# Patient Record
Sex: Female | Born: 1955 | Smoking: Never smoker
Health system: Northeastern US, Community
[De-identification: ages and names within clinical notes are randomized; demographics above are authoritative.]

---

## 2019-02-14 HISTORY — PX: CATARACT REMOVAL INSERTION OF LENS: OPH121

## 2019-02-14 HISTORY — PX: VITRECTOMY PARS PLANA REMOVE PRERETINAL MEMBRANE: PRO01201

## 2019-04-07 HISTORY — PX: RETINAL DETACHMENT REPAIR W/ SCLERAL BUCKLE LE: SHX2338

## 2020-04-04 ENCOUNTER — Emergency Department
Admission: AD | Admit: 2020-04-04 | Discharge: 2020-04-04 | Disposition: A | Discharge status: Home / Self Care | Payer: No Typology Code available for payment source | Source: Ambulatory Visit | Attending: Emergency Medicine | Admitting: Emergency Medicine

## 2020-04-04 ENCOUNTER — Encounter (HOSPITAL_BASED_OUTPATIENT_CLINIC_OR_DEPARTMENT_OTHER): Payer: Self-pay

## 2020-04-04 DIAGNOSIS — H5711 Ocular pain, right eye: Secondary | ICD-10-CM | POA: Diagnosis present

## 2020-04-04 DIAGNOSIS — H539 Unspecified visual disturbance: Secondary | ICD-10-CM | POA: Diagnosis present

## 2020-04-04 MED ORDER — TETRACAINE HCL 0.5 % OP SOLN
OPHTHALMIC | Status: DC
Start: 2020-04-04 — End: 2020-04-04
  Filled 2020-04-04: qty 4

## 2020-04-04 MED ORDER — FLUORESCEIN SODIUM 1 MG OP STRP
ORAL_STRIP | OPHTHALMIC | Status: DC
Start: 2020-04-04 — End: 2020-04-04
  Filled 2020-04-04: qty 1

## 2020-04-04 NOTE — Narrator Note (Signed)
MD examined patient immediately. Patient then cleared for discharge and was given discharge instructions. Family member to transport patient to [facility].

## 2020-04-04 NOTE — UC Triage Notes (Signed)
Presents with right eye pain and photosensitivity times five days. Hx of retinal detachment.

## 2020-04-04 NOTE — UC Provider Notes (Signed)
The patient was seen primarily by me. The nursing record was reviewed. Select prior records as available electronically through the Epic record were reviewed.    History, physical exam and disposition were conducted with an official hospital [place] interpreter.    I began my evaluation of the patient at 7:21 PM.    HPI:    Hannah Warren is a 64 year old female patient who presents for evaluation of right eye pain. She has had this for five days. She had eye surgery last year in [place]. This was complicated by a retinal detachment which required further procedures.    ROS: Pertinent positives were reviewed as per the HPI above.    Specifically, no fever, chills, double vision, nosebleeds, cough, dyspnea, chest pain, nausea, vomiting, abdominal pain, rash, dysuria, joint pains, extremity swelling or headaches. All other systems reviewed and negative.    Allergies: Review of Patient's Allergies indicates:  Not on File    Hannah Warren  Language of care: [place] Hannah Warren)  MRN: [id number]  PCP: None  Mode of arrival: Walk-in.  Arrival time:     Chief complaint: No chief complaint on file.    Past Medical History:  No past medical history on file.    Social History:   Tobacco: No    Triage Vital Signs:   ED Triage Vitals [04/04/20 1917]   ED Triage Vitals Brief Group      Temp 98.5 F      Pulse 80      Resp 16      BP (!) 178/91      SpO2 97 %      Pain Score      I reviewed the triage vital signs.    Medications:  None       Physical Exam:   GENERAL: No acute distress, non-toxic     HEAD: No evidence of trauma.     EYES: No lid swelling. No periorbital swelling. No crusting or discharge Sclera anicteric. No conjunctival injection. Pupils equal, round, and reactive to light. Extraocular movements intact without pain or diplopia. No hyphema. No hypopyon. Right temporal superior field cut. Photophobia present. IOPs OD 8, OS 10.    NECK: Supple. No stridor.     PULMONARY: Respirations even and unlabored. No  accessory muscle use.      MUSCULOSKELETAL: No obvious deformities or trauma. Warm and well perfused.     SKIN: Warm and dry, no rash on exposed skin.    NEUROLOGIC: Alert and oriented x3. Speech fluent without dysarthria.     PSYCHIATRIC: Appropriate, cooperative.      Radiology Results:  N/A Medications Given:  Medications - No data to display   EKG:  N/A      Medical decision-making and course:  64 year old female patient presents with right eye pain and vision changes. She has a history of retinal detachment and lens replacement surgery last year in [place].     I recommended she proceed to [facility] ED for further evaluation. I called to place an expect.    I counseled the patient on her results, diagnosis, expected course, and treatments. I gave verbal discharge instructions, including reasons to return. She was in agreement with the plan for discharge and I answered all questions to her satisfaction.    Diagnosis/Diagnoses:  (H57.11) Acute right eye pain    (H53.9) Vision changes     Disposition:  Discharged.    Condition at disposition:  Stable.    Hannah Warren  Ladene Artist, MD  04/04/2020   Emergency Medicine  [facility]    This Emergency Department patient encounter note was created using voice-recognition software and in real time during the ED visit. Please excuse any typographical errors that have not yet been reviewed and corrected.

## 2020-04-04 NOTE — Narrator Note (Signed)
Patient Disposition  Patient education for diagnosis, medications, activity, diet and follow-up.  Patient left UC 7:49 PM.  Patient rep received written instructions.    Interpreter to provide instructions: Yes; Interpreter ID: [place]    Patient belongings with patient: YES    Have all existing LDAs been addressed? N/A    Have all IV infusions been stopped? N/A    Destination: Home

## 2020-04-04 NOTE — Discharge Instructions (Signed)
[facility]

## 2020-04-16 MED FILL — KETOROLAC  SOL 0.5%: 56 days supply | Qty: 15 | Fill #0

## 2020-04-16 MED FILL — PREDNISOLONE SUS 1% OP: 56 days supply | Qty: 15 | Fill #0

## 2020-04-23 ENCOUNTER — Ambulatory Visit: Payer: No Typology Code available for payment source | Attending: Ophthalmology | Admitting: Ophthalmology

## 2020-04-23 ENCOUNTER — Other Ambulatory Visit: Payer: Self-pay

## 2020-04-23 ENCOUNTER — Encounter (HOSPITAL_BASED_OUTPATIENT_CLINIC_OR_DEPARTMENT_OTHER): Payer: Self-pay | Admitting: Ophthalmology

## 2020-04-23 DIAGNOSIS — H524 Presbyopia: Secondary | ICD-10-CM

## 2020-04-23 NOTE — Progress Notes (Signed)
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  Retina attached today.  Followed by Dr. Eula Listen at [facility] who is treating with ketorolac and Pred forte OD qid  Glaucoma suspect:  Prominent optic nerve cupping.  Needs baseline testing  Cataract OS/Pseudophakia OD  Presbyopia    Plan:  Rx given for spectacle lenses if desired.  Continue keratolac and Pred forte OD qid  Return for formal visual field testing and optic nerve imaging.  Needs pachymetry  See 1 month

## 2020-04-23 NOTE — Progress Notes (Signed)
Kashana Breach Myriam Brandhorst is a 64 year old female.     Emergency visit     Pt reports eyelid swelling, with (white) discharge OD.  Right eye closed shut in the mornings.    Pt was seen at [facility] 2 weeks ago [facility] = normal eye exam.  Retina exam was good.    S/p retina detachment surgery and cataract surgery OD in [place] 05/2018.    No pain, No headache.  No flashes or floaters.

## 2020-04-24 ENCOUNTER — Other Ambulatory Visit: Payer: Self-pay

## 2020-04-24 ENCOUNTER — Ambulatory Visit: Payer: No Typology Code available for payment source | Attending: Ophthalmology

## 2020-04-24 DIAGNOSIS — H40003 Preglaucoma, unspecified, bilateral: Secondary | ICD-10-CM | POA: Diagnosis not present

## 2020-04-25 NOTE — Progress Notes (Signed)
Both eyes repeated few times  Procedure performed by technician under supervision of attending MD.

## 2020-05-01 ENCOUNTER — Telehealth (HOSPITAL_BASED_OUTPATIENT_CLINIC_OR_DEPARTMENT_OTHER): Payer: Self-pay

## 2020-05-01 NOTE — Telephone Encounter (Signed)
Spoke with pt through interpreter De Nurse #    [phone]   Pt has an appointment January 25 th and she was calling to see if she could be seen sooner because she has some free time available.  I explained that she should keep her appt on 05-21-20 and if anything changes for her to call office.  Pt understands and agree's with plan

## 2020-05-01 NOTE — Telephone Encounter (Signed)
-----   Message from Lydia Cintron sent at 05/01/2020 11:57 AM EST -----  Contact: [phone]  Scratch in her R eye

## 2020-05-01 NOTE — Telephone Encounter (Signed)
-----   Message from [facility] sent at 05/01/2020 11:57 AM EST -----  Contact: [phone]  Scratch in her R eye

## 2020-05-20 ENCOUNTER — Encounter (HOSPITAL_BASED_OUTPATIENT_CLINIC_OR_DEPARTMENT_OTHER): Payer: Self-pay | Admitting: Ophthalmology

## 2020-05-20 ENCOUNTER — Other Ambulatory Visit: Payer: Self-pay

## 2020-05-20 ENCOUNTER — Ambulatory Visit: Payer: No Typology Code available for payment source | Attending: Ophthalmology | Admitting: Ophthalmology

## 2020-05-20 DIAGNOSIS — H3581 Retinal edema: Secondary | ICD-10-CM | POA: Insufficient documentation

## 2020-05-20 DIAGNOSIS — H40003 Preglaucoma, unspecified, bilateral: Secondary | ICD-10-CM | POA: Diagnosis not present

## 2020-05-20 DIAGNOSIS — Z961 Presence of intraocular lens: Secondary | ICD-10-CM | POA: Insufficient documentation

## 2020-05-20 MED ORDER — BRIMONIDINE TARTRATE 0.2 % OP SOLN
1.00 [drp] | Freq: Two times a day (BID) | OPHTHALMIC | 11 refills | Status: AC
Start: 2020-05-20 — End: 2021-05-20

## 2020-05-20 NOTE — Progress Notes (Signed)
Impression:  Glaucoma suspect:  Prominent optic nerve cupping.  IOP mildly elevated OD>OS (?early steroid effect?).  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  Optic nerve imaging relatively normal with mrnfl 88/92.  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   Followed by Dr. Eula Listen at [facility] who is treating with ketorolac and Pred forte OD qid with decreased macular edema by OCT  Cataract OS/Pseudophakia OD  Presbyopia    Plan:  Continue keratolac and Pred forte OD qid  Trial of brimonidine .2% OD while using prednisolone.  Needs pachymetry (machine being repaired today)  See 1 month

## 2020-05-20 NOTE — Progress Notes (Signed)
Here for f/U,H/O glaucoma Suspect,Macular edema OD after S/P ERM peel with Vitrectomy,Cataract OS,PSeudophakia OD,Presbyopia.  Vf and Macula/ON Oct was done in 04/25/2020.  Macular Oct done in OD today.   Vision is stable OU.   Eye feels swollen in the morning.   No pain.   No flashes/floaters.    Ocular meds   Ketorolac 4/day OD LD this am   Prednisolone 4/day OD LD this am

## 2020-05-21 ENCOUNTER — Ambulatory Visit: Payer: No Typology Code available for payment source | Attending: Internal Medicine | Admitting: Internal Medicine

## 2020-05-21 ENCOUNTER — Ambulatory Visit (HOSPITAL_BASED_OUTPATIENT_CLINIC_OR_DEPARTMENT_OTHER): Payer: Self-pay | Admitting: Ophthalmology

## 2020-05-21 ENCOUNTER — Encounter (HOSPITAL_BASED_OUTPATIENT_CLINIC_OR_DEPARTMENT_OTHER): Payer: Self-pay | Admitting: Internal Medicine

## 2020-05-21 DIAGNOSIS — Z85038 Personal history of other malignant neoplasm of large intestine: Secondary | ICD-10-CM | POA: Diagnosis present

## 2020-05-21 DIAGNOSIS — R252 Cramp and spasm: Secondary | ICD-10-CM | POA: Diagnosis present

## 2020-05-21 DIAGNOSIS — Z7689 Persons encountering health services in other specified circumstances: Secondary | ICD-10-CM | POA: Diagnosis present

## 2020-05-21 NOTE — Progress Notes (Signed)
Hannah Warren is a 65 year old female is seen to day to establish care   Interpreter present yes    Bilateral hands and feet cramp for 3 months. symptoms  are intermittent   Feet pain get better when she walks   Pain is more frequent at night   Patient has colon cancer - she had surgery in [place] 2012- has colonoscopy   Psychosocial - drinks occasional alcohol , no smoking , no drug - Moved to the [place] 4 years ago - she lives with friends   She has two children they are living in [place]   Mother died of heart Attack at age 32  Father died of lung complications      Review of Systems   Constitutional: Negative.    HENT: Negative.    Eyes: Positive for visual disturbance (in the right eye ).   Respiratory: Negative.    Cardiovascular: Negative.    Gastrointestinal: Negative.    Genitourinary: Negative.    Musculoskeletal: Negative.    Neurological: Negative.    Hematological: Negative.    Psychiatric/Behavioral: Negative.    OBJECTIVE:   She is  speaking in full sentences  .       (Z76.89) Encounter to establish care  (primary encounter diagnosis)  Comment: Health maintenance reviewed and updated      (R25.2) Cramp and spasm  Patient wants to wait for the work up as she is plannning to travel next month    Plan: CBC WITH PLATELET, COMPREHENSIVE METABOLIC         PANEL, TSH (THYROID STIMULATING HORMONE),         HEMOGLOBIN A1C, PROTEIN ELECTROPHORESIS REFLEX,        VITAMIN B12            (Z85.038) History of colon cancer  Comment: will refer patient to GI    We discussed the patients current medications. The patient expressed understanding and no barriers to adherence were identified.

## 2020-05-23 ENCOUNTER — Ambulatory Visit (HOSPITAL_BASED_OUTPATIENT_CLINIC_OR_DEPARTMENT_OTHER): Payer: No Typology Code available for payment source

## 2021-03-06 ENCOUNTER — Other Ambulatory Visit: Payer: Self-pay | Admitting: Internal Medicine

## 2022-05-13 ENCOUNTER — Other Ambulatory Visit (HOSPITAL_BASED_OUTPATIENT_CLINIC_OR_DEPARTMENT_OTHER): Payer: Self-pay | Admitting: Internal Medicine

## 2023-07-29 ENCOUNTER — Other Ambulatory Visit: Payer: Self-pay | Admitting: Internal Medicine

## 2023-12-29 IMAGING — CT Abdomen^ABDOME_TOTAL_COM_CONTRASTE (Adult)
2 of 4 series · 13 of 32 positions shown, 18 images · non-contrast
Comparison: none

[Series 2: s/c · axial · 0.86mm/px · z∈[-117,+213]mm · 6 of 232 slices shown, 11 images]
[im 34/232  soft-tissue]
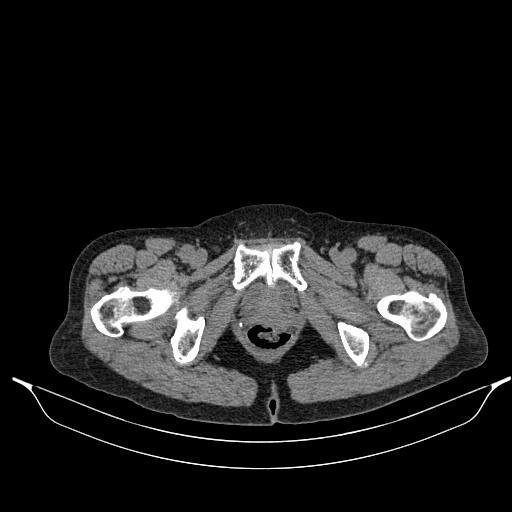
[im 34/232  bone]
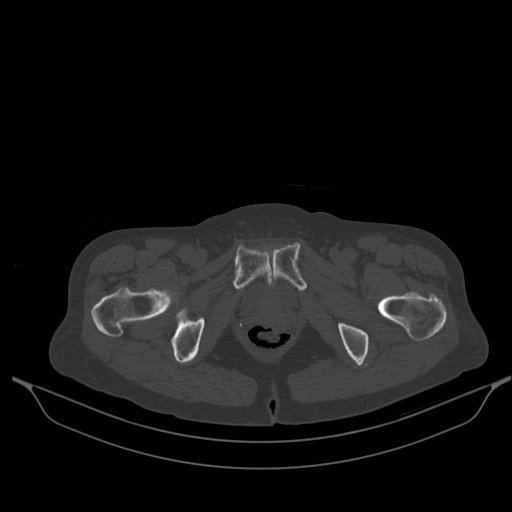
[im 67/232  soft-tissue]
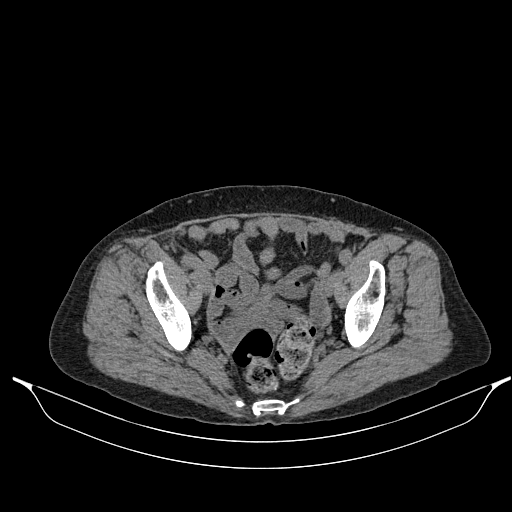
[im 100/232  soft-tissue]
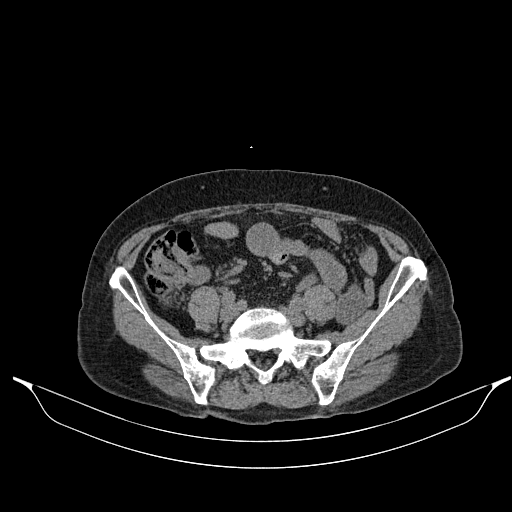
[im 100/232  lung]
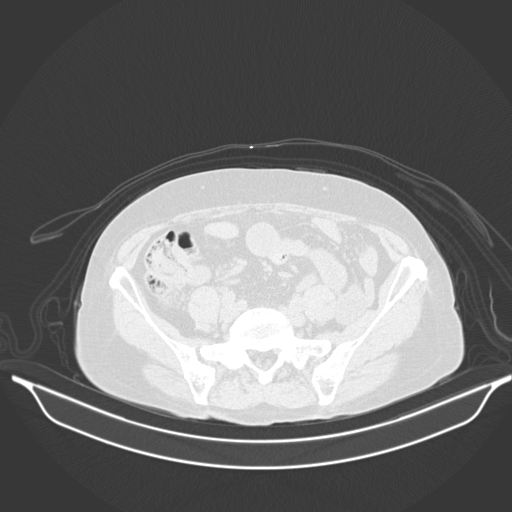
[im 133/232  soft-tissue]
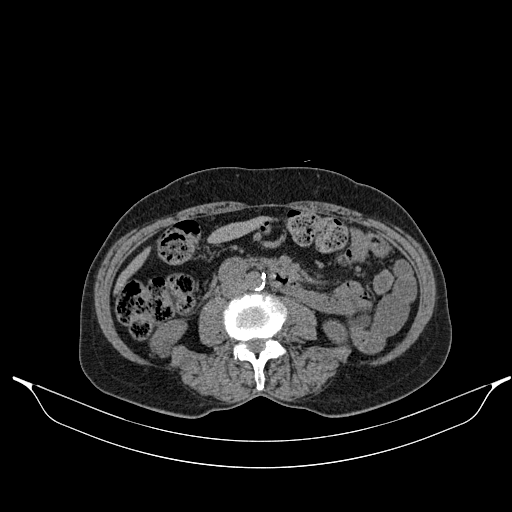
[im 133/232  lung]
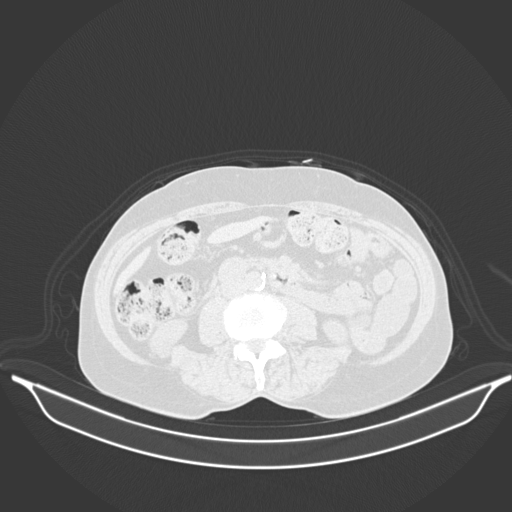
[im 166/232  soft-tissue]
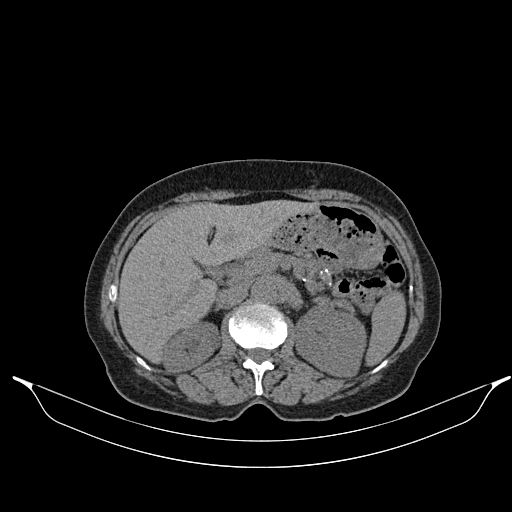
[im 166/232  lung]
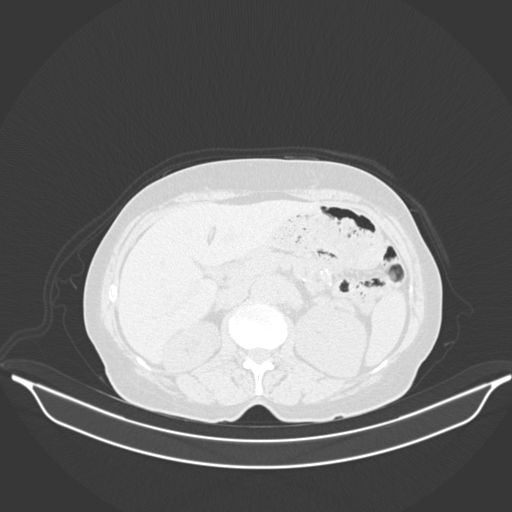
[im 199/232  soft-tissue]
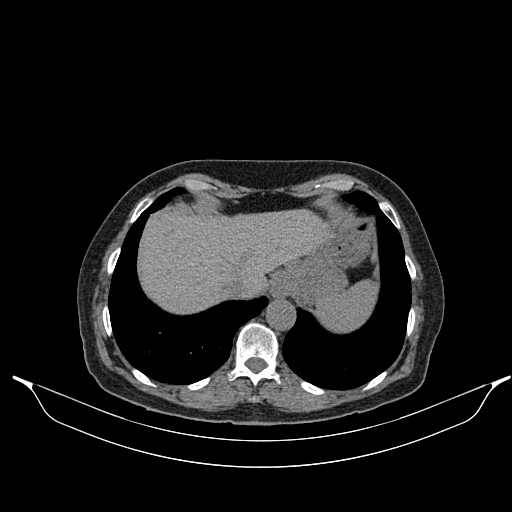
[im 199/232  lung]
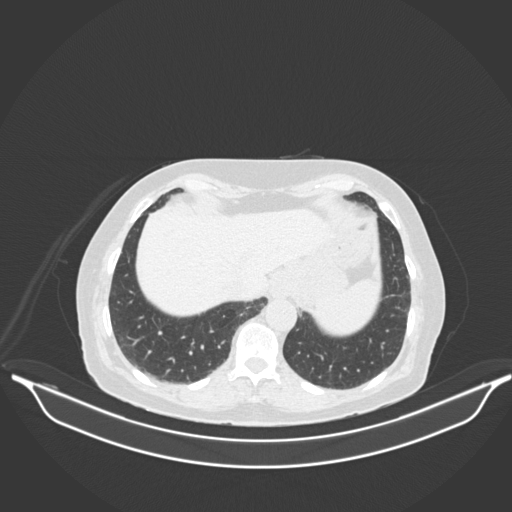

[Series 4: venosa · axial · 0.86mm/px · z∈[-125,+221]mm · 7 of 231 slices shown]
[im 29/231  soft-tissue]
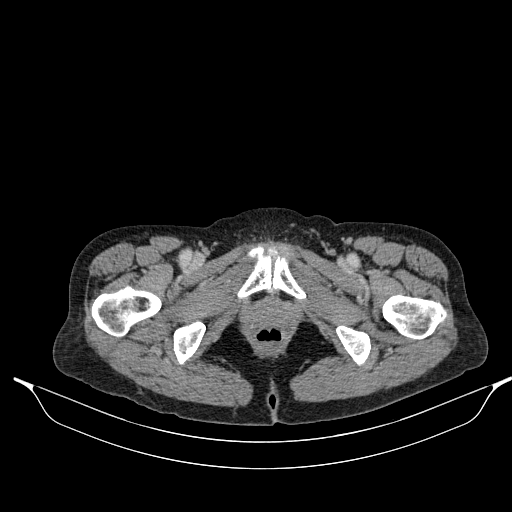
[im 58/231  soft-tissue]
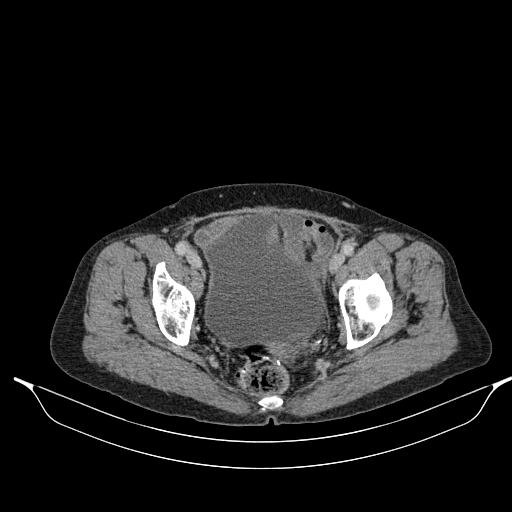
[im 87/231  soft-tissue]
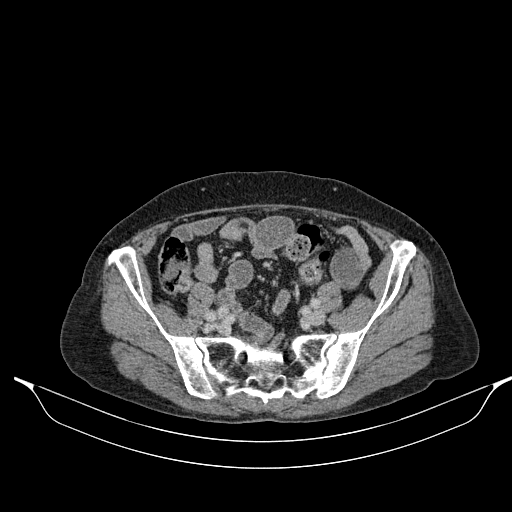
[im 116/231  soft-tissue]
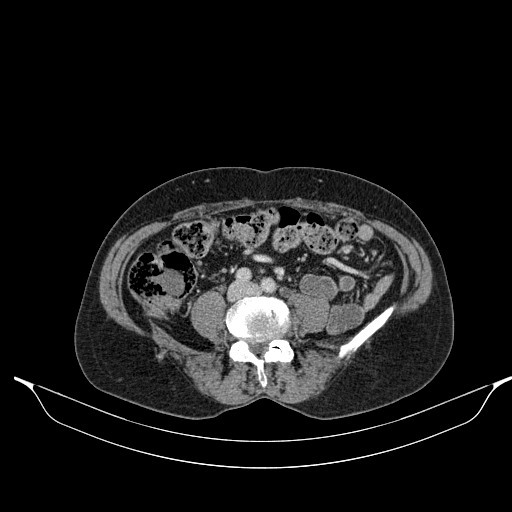
[im 144/231  soft-tissue]
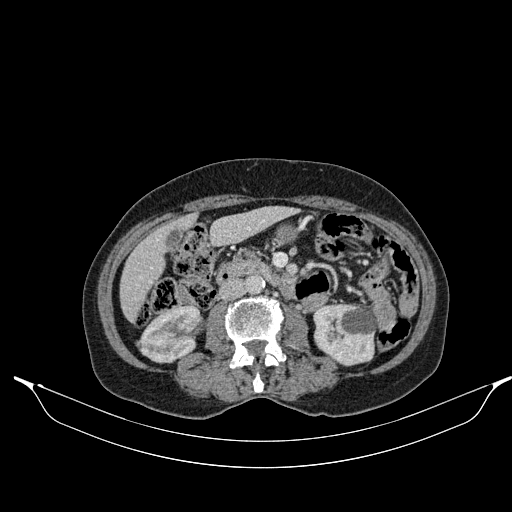
[im 173/231  soft-tissue]
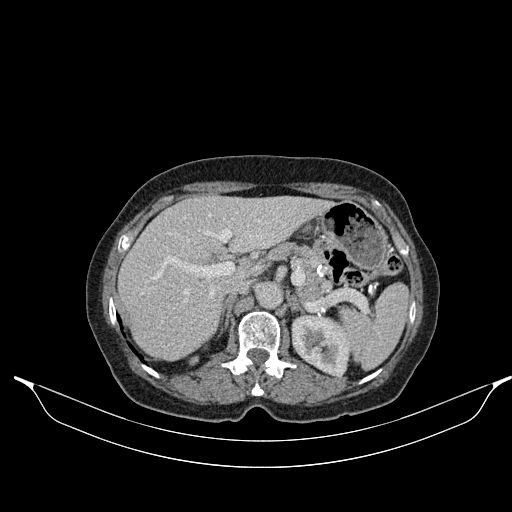
[im 202/231  soft-tissue]
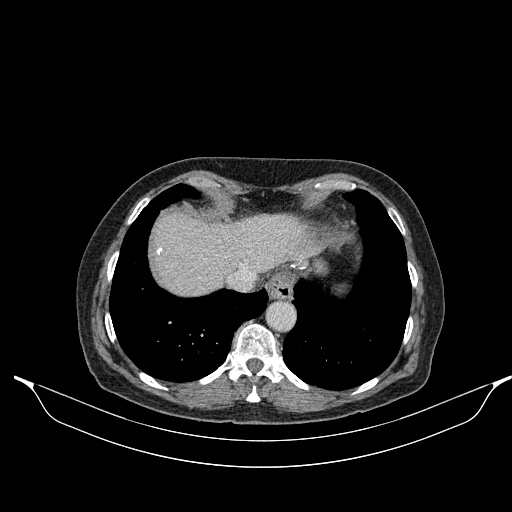

[13 of 32 positions shown; findings below may reference images not displayed]

Médico: -
Técnica:
Aquisições volumétricas com reconstruções multiplanares. Administrado contraste endovenoso.
Relatório:
Fígado com dimensões, morfologia e densidade normais.
Não há dilatação das vias biliares intra ou extra-hepáticas.
TOMOGRAFIA COMPUTADORIZADA DO ABDOME SUPERIOR E PELVE
Pâncreas com morfologia e Ivan Franﬁciente de atenuação preservados. Não há dilatação do ducto pancreático principal.
Baço com topograﬁa, dimensões e densidade normais. Adrenais com aspecto preservado.
Rins tópicos, com dimensões e contornos preservados. Cistos corticais bilaterais com até 3,1 cm. Não há cálculos ou
hidronefrose.
Ureter direito com implante na parede lateral direita da bexiga (pós cirúrgico).
Aorta e veia cava inferior com trajeto e calibre normais. Ateromatose aorto-ilíaca e de ramos viscerais.
Bexiga urinária com capacidade, morfologia e conteúdo normais.
Flebólitos pélvicos.
Ausência de linfonodomegalias ou líquido livre na cavidade.
Pequena hérnia umbilical de conteúdo gorduroso.
Alterações degenerativas na coluna vertebral.
Impressão:
Cistos renais bilaterais.
Ateromatose.
Hérnia umbilical.
[place]

## 2024-03-03 IMAGING — MR RM - ARTICULAR OMBRO DIREITO
4 of 8 series · 18 of 40 positions shown · non-contrast
Comparison: none

[Series 4: PD fat-sat · axial · 3.0mm · 0.31mm/px · z∈[-31,+43]mm · 7 of 24 slices shown (1 of 3)]
[im 1/24]
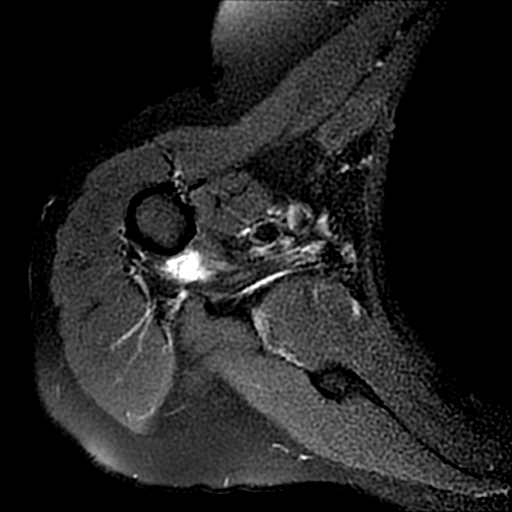
[im 4/24]
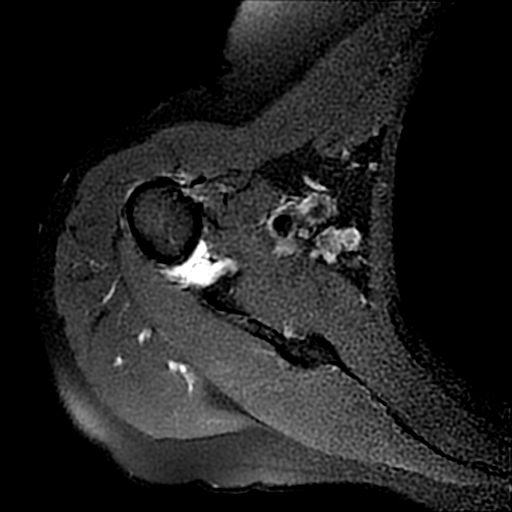
[im 8/24]
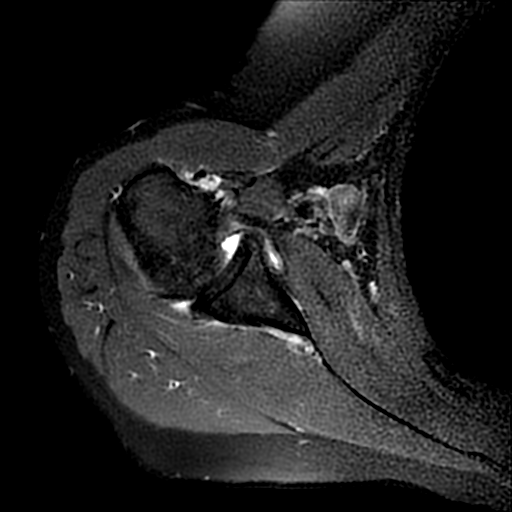
[im 12/24]
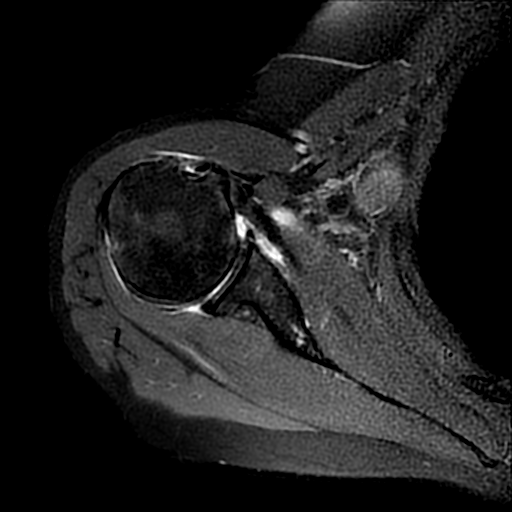
[im 16/24]
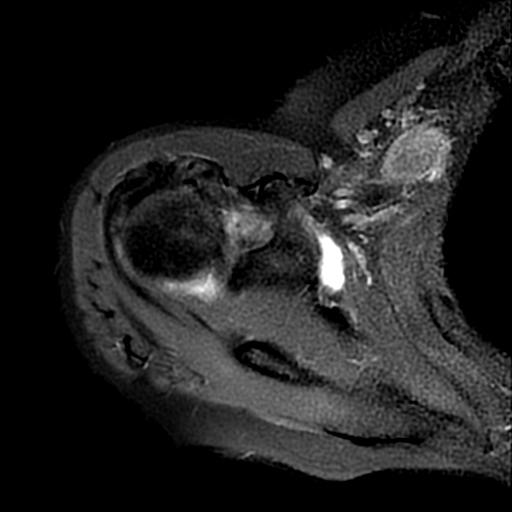
[im 20/24]
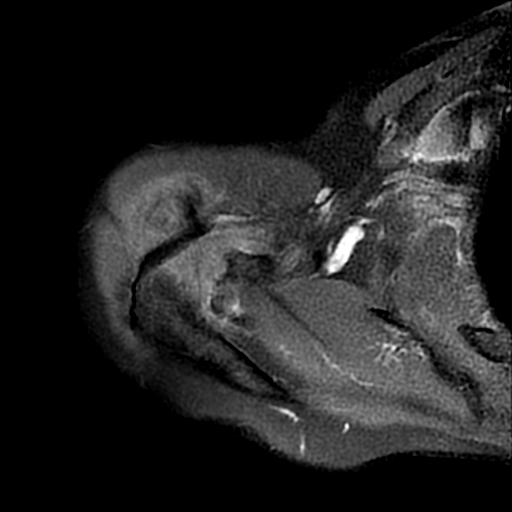
[im 24/24]
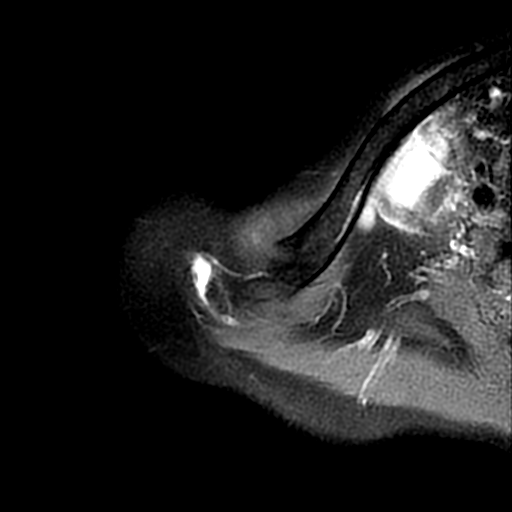

[Series 5: PD fat-sat · coronal · 4.0mm · 0.31mm/px · 5 of 20 slices shown (2 of 3)]
[im 1/20]
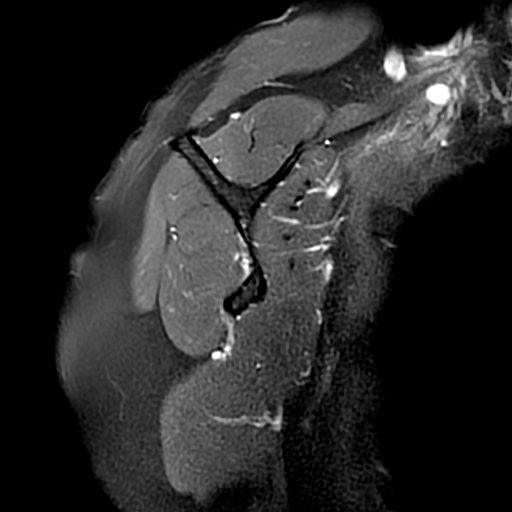
[im 4/20]
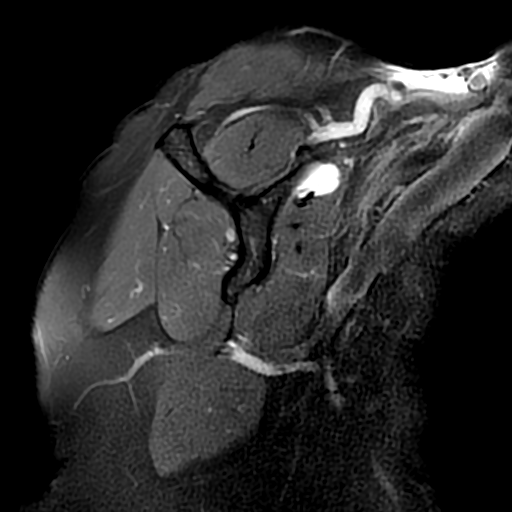
[im 8/20]
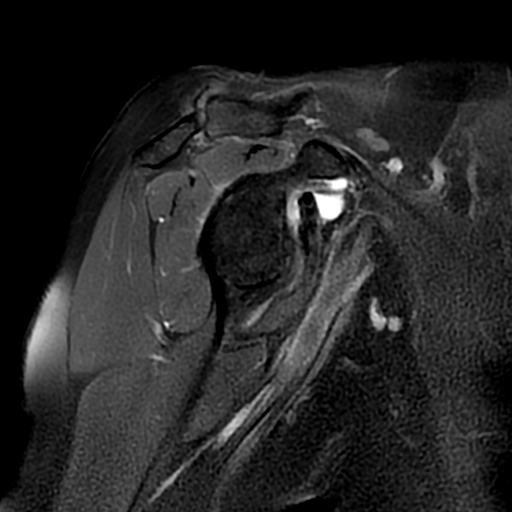
[im 12/20]
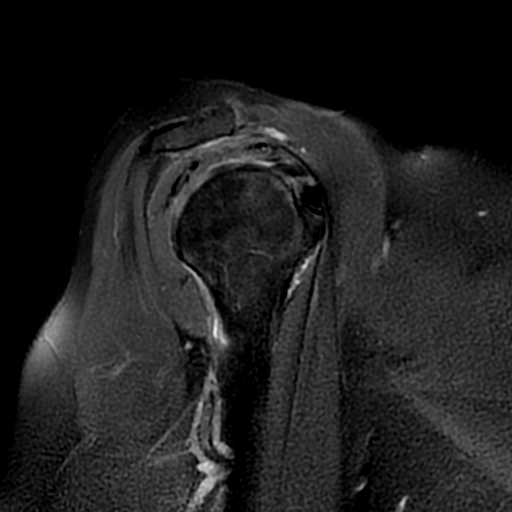
[im 20/20]
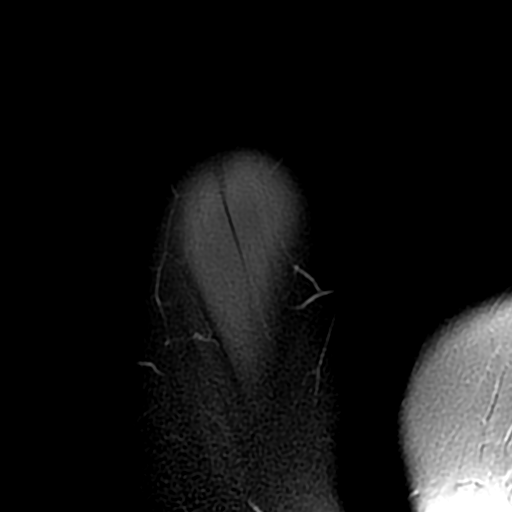

[Series 6: T1 · coronal · 4.0mm · 0.31mm/px · 3 of 20 slices shown]
[im 4/20]
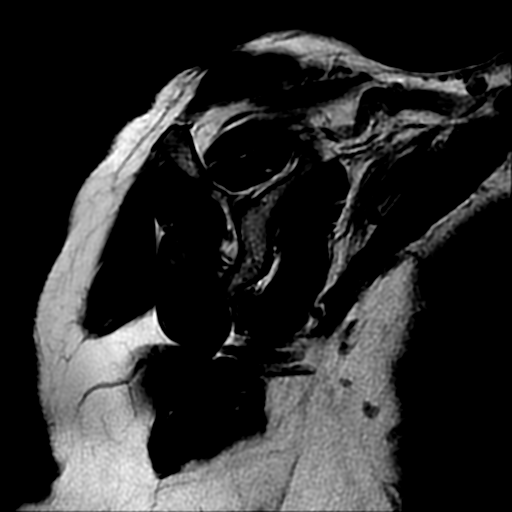
[im 12/20]
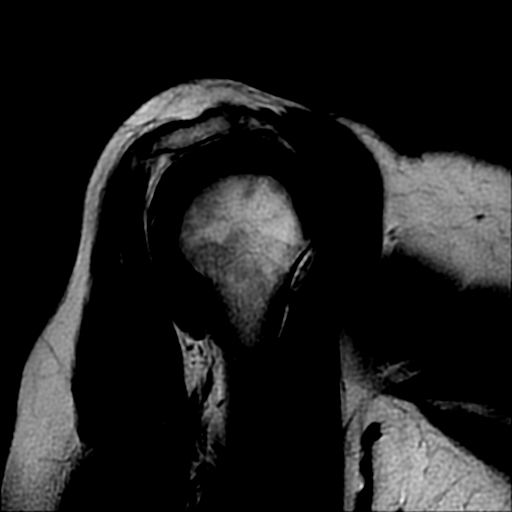
[im 20/20]
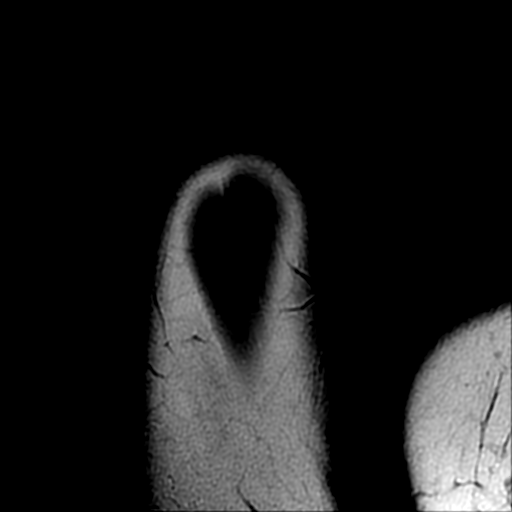

[Series 7: PD fat-sat · oblique · 3.0mm · 0.31mm/px · 3 of 20 slices shown (3 of 3)]
[im 4/20]
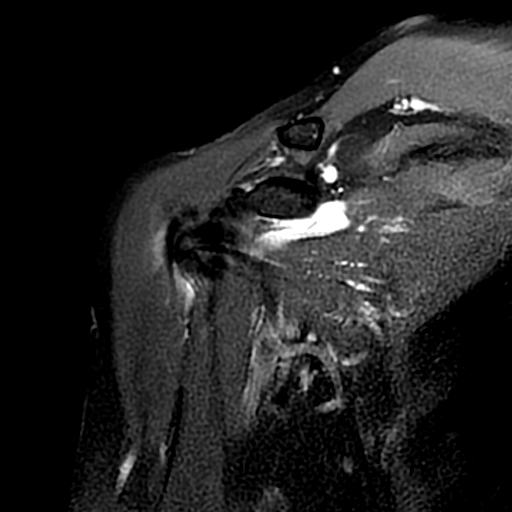
[im 12/20]
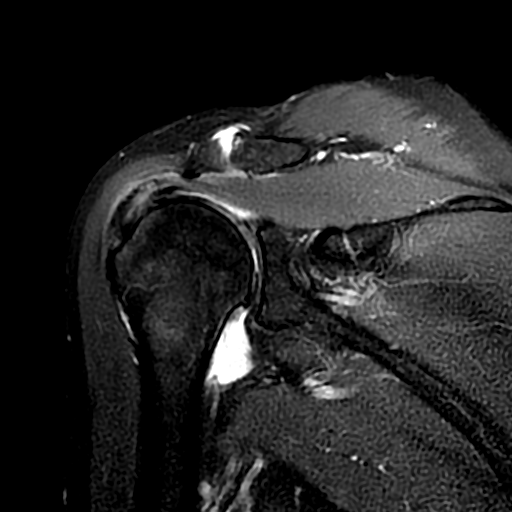
[im 20/20]
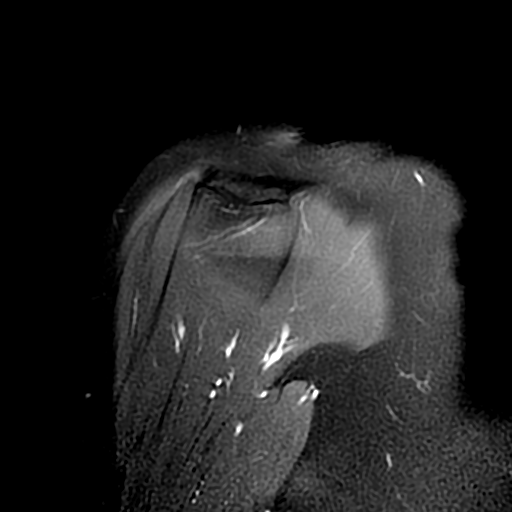

[18 of 40 positions shown; findings below may reference images not displayed]

Médico: -

Método:
Estudo realizado com a técnica FE e FSE, com cortes multiplanares.

Análise:
Moderada artropatia degenerativa acromioclavicular com aﬁlamento condral e osteóﬁtos marginais, notando-se
pequenos cistos e focos de edema ósseo subcondrais, de origem mecânica.
RESSONÂNCIA MAGNÉTICA DO OMBRO DIREITO
Acrômio retilíneo, sem inclinações anômalas signiﬁcativas.
Acentuada tendinopatia do supraespinhal com focos degenerativos intrassubstanciais, sem roturas.
Há leve edema ósseo e pequenos cistos subcorticais na tuberosidade maior umeral, indicando entesopatia. Moderada
tendinopatia do infraespinhal caracterizada por focos degenerativos intrassubstanciais, sem roturas.
Discreta tendinopatia do subescapular que apresenta focos degenerativos intrassubstanciais, sem roturas.
Discreta tendinopatia do segmento articular do cabo longo do bíceps que apresenta leve espessamento e focos
degenerativos intrassubstanciais, sem roturas.
Espessamento/edema da bolsa subacromial/subdeltoídea, indicando leve bursite. Há pequena distensão líquida da
bolsa subcoracóide.
Discreta artropatia degenerativa glenoumeral caracterizada por esboços osteoﬁtários marginais e aﬁlamento condral,
em ambos os componentes, sem repercussões subcondrais.
Demais estruturas ósseas sem alterações evidentes.
Discreto derrame articular glenoumeral com focos de espessamento sinovial, indicando sinovite.
Alteração degenerativa com irregularidade e ﬁssuras nos segmentos superiores do lábio da glenóide, sem
destacamentos.
Ventres musculares de aspecto habitual.

Conclusão:
[facility] - [address] [place] [id number], [place]
Médico: -
Sinais de tendinopatia do manguito rotador, sem roturas.
Entesopatia na tuberosidade maior umeral.
Moderada artropatia degenerativa acromioclavicular.
Discreta tendinopatia da cabeça longa do bíceps.
Bursite subacromial/subdeltoídea e subcoracoide.
Discreta artropatia degenerativa glenoumeral.
Alteração degenerativa do lábio superior da glenoide.
Discreto derrame articular glenoumeral com sinovite.
[facility] - [address] [place] [id number], [place]

## 2024-03-03 IMAGING — MR RM - ARTICULAR OMBRO ESQUERDO
4 of 8 series · 19 of 40 positions shown · non-contrast
Comparison: none

[Series 4: PD fat-sat · axial · 3.0mm · 0.31mm/px · z∈[-7,+71]mm · 7 of 25 slices shown (1 of 3)]
[im 1/25]
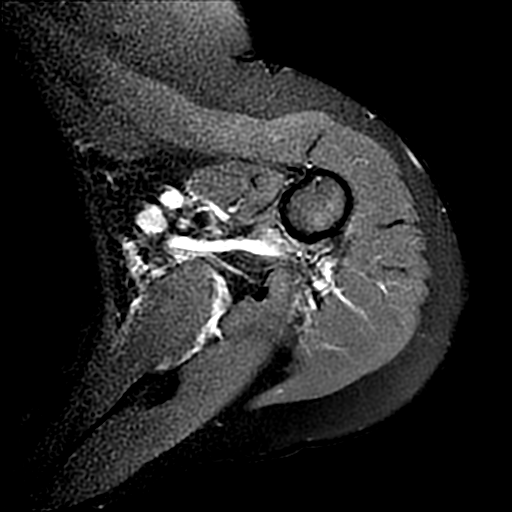
[im 5/25]
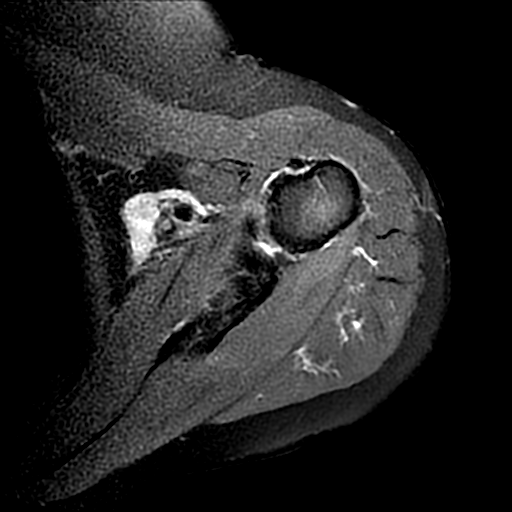
[im 9/25]
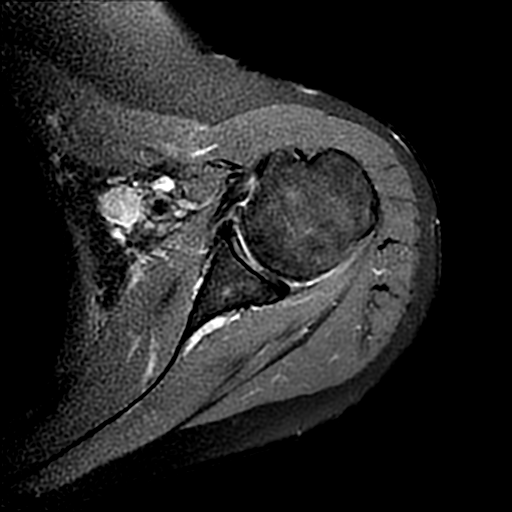
[im 13/25]
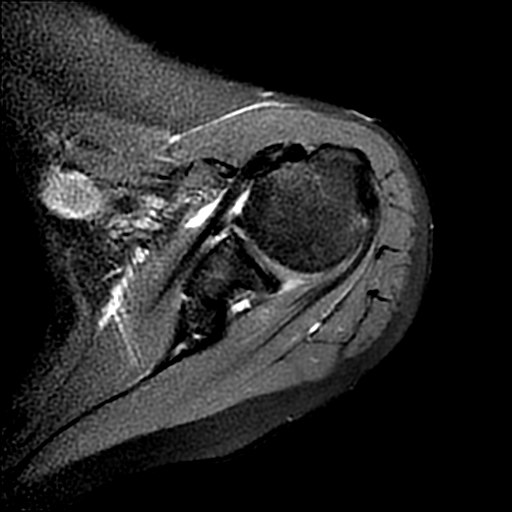
[im 17/25]
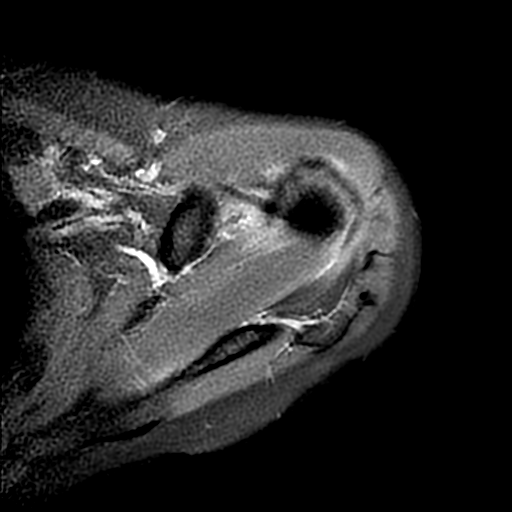
[im 21/25]
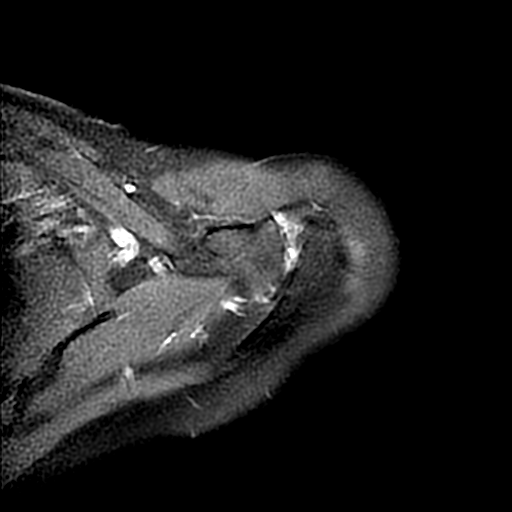
[im 25/25]
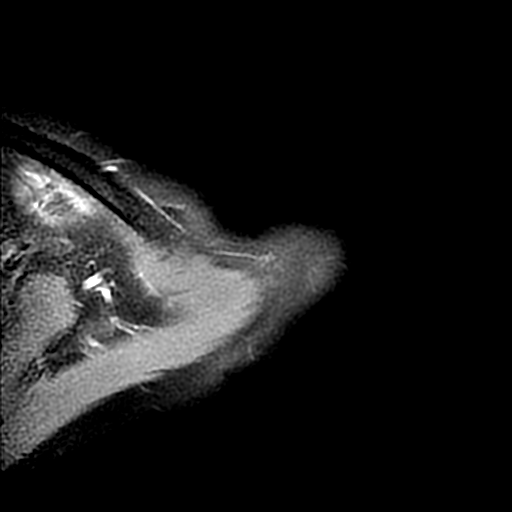

[Series 5: PD fat-sat · oblique · 4.0mm · 0.35mm/px · 6 of 20 slices shown (2 of 3)]
[im 1/20]
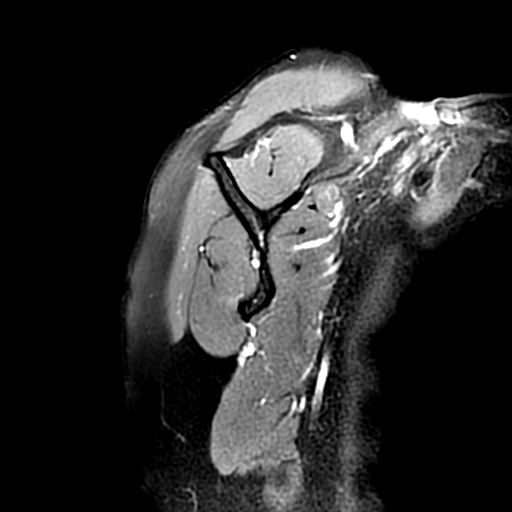
[im 4/20]
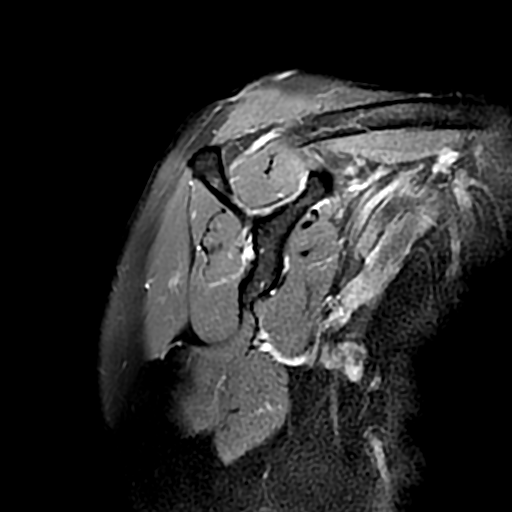
[im 8/20]
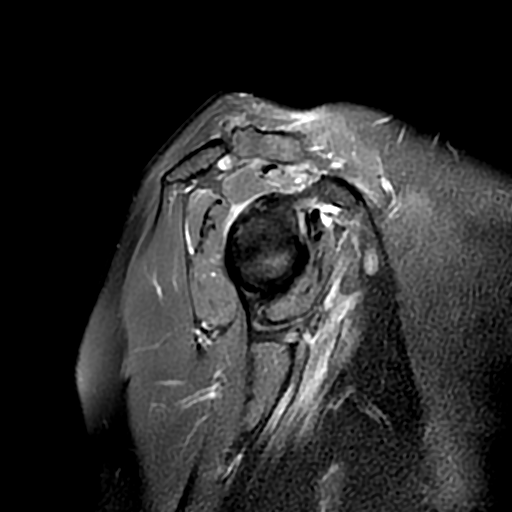
[im 12/20]
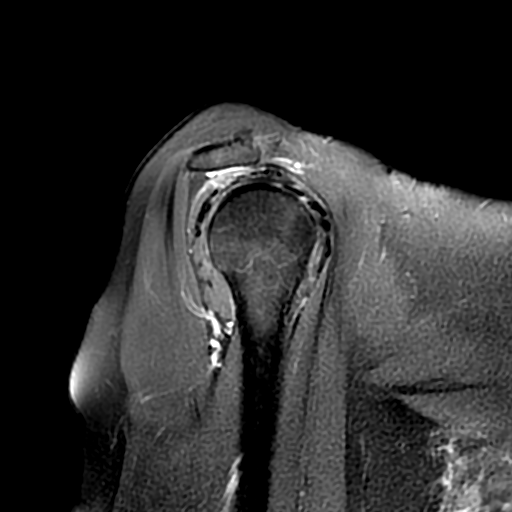
[im 16/20]
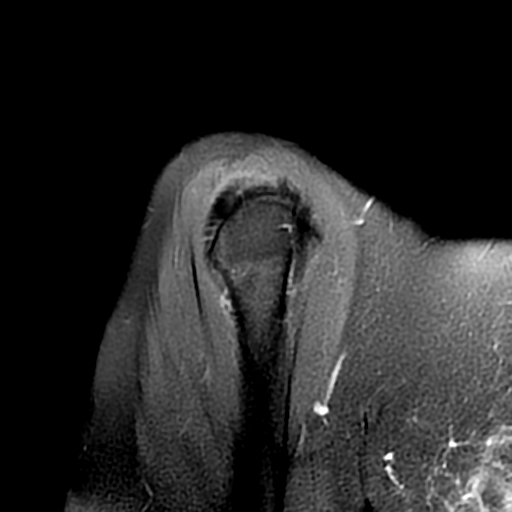
[im 20/20]
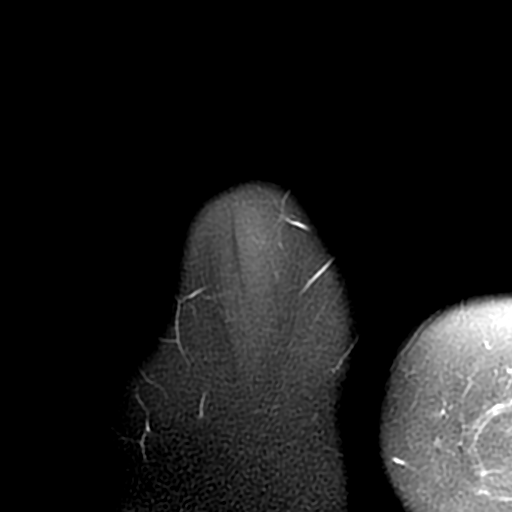

[Series 6: T1 · oblique · 4.0mm · 0.35mm/px · 3 of 20 slices shown]
[im 4/20]
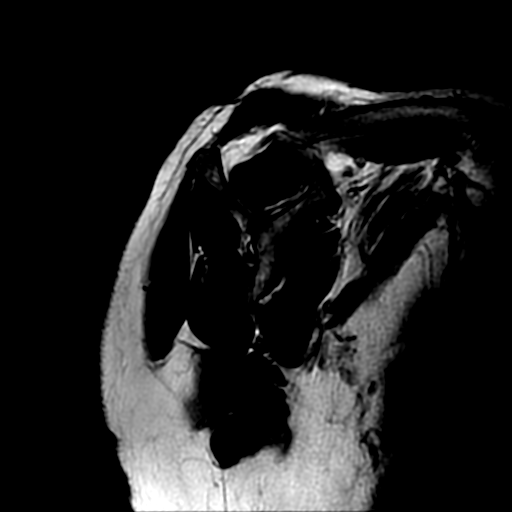
[im 12/20]
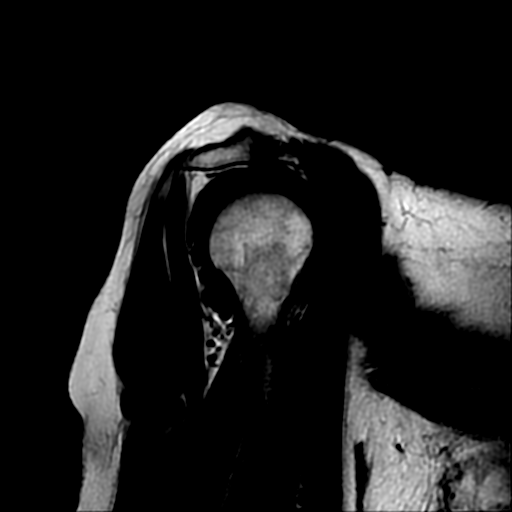
[im 20/20]
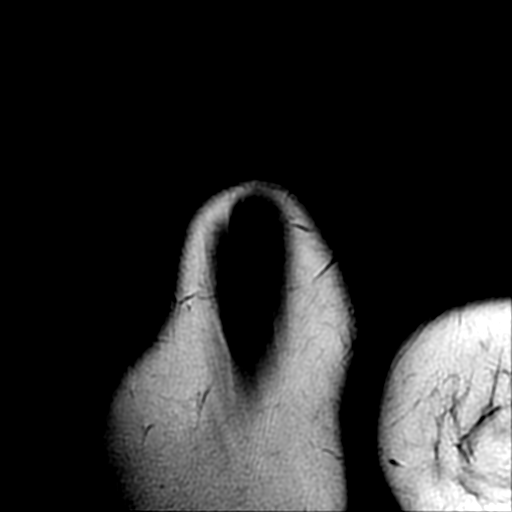

[Series 7: PD fat-sat · oblique · 3.0mm · 0.33mm/px · 3 of 21 slices shown (3 of 3)]
[im 5/21]
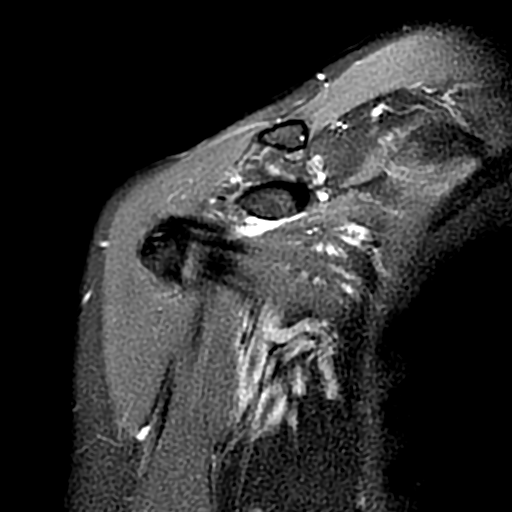
[im 13/21]
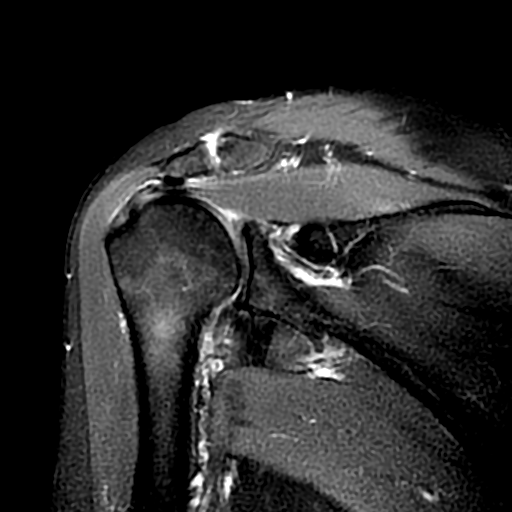
[im 21/21]
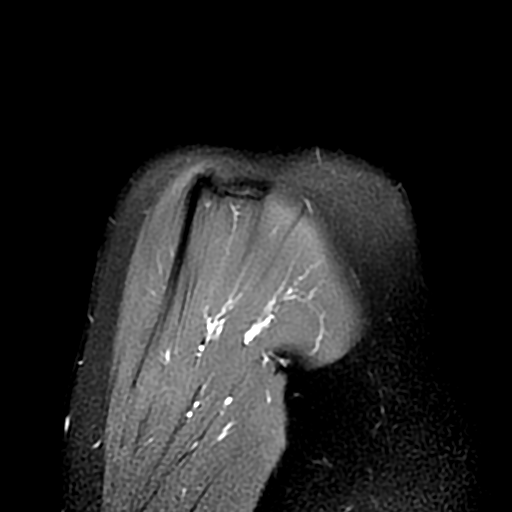

[19 of 40 positions shown; findings below may reference images not displayed]

Médico: -

Método:
Estudo realizado com a técnica FE e FSE, com cortes multiplanares.

Análise:
Discreta artropatia degenerativa acromioclavicular com aﬁlamento condral e esboços osteoﬁtários marginais, notando-
se focos de edema ósseo subcondrais, de origem mecânica.
RESSONÂNCIA MAGNÉTICA DO OMBRO ESQUERDO
Acrômio curvilíneo, sem inclinações anômalas signiﬁcativas.
Discreta tendinopatia do supraespinhal e do infraespinhal caracterizada por focos degenerativos intrassubstanciais,
sem roturas.
Discreta tendinopatia do subescapular que apresenta focos degenerativos intrassubstanciais, sem roturas.
Pequena distensão líquida da bolsa subacromial/subdeltoídea, denotando leve bursite.
Demais estruturas ósseas sem alterações evidentes.
Não há evidências de derrame articular ou sinovite.
Rotura no segmento superior do lábio da glenóide, com possível extensão à base do cabeça longa do bíceps.
Ventres musculares de aspecto habitual.

Impressão Diagnóstica:
Discreta artropatia degenerativa acromioclavicular, com focos de edema ósseo subcondrais, de origem mecânica.
Tendinopatia do supraespinhal e do infraespinhal, sem roturas.
Discreta tendinopatia do subescapular, sem roturas.
Discreta bursite subacromial/subdeltoídea.
Rotura no segmento superior do lábio da glenóide, com possível extensão à base da cabeça longa do bíceps.
[facility] - [address], [place] [id number], [employer]
